# Patient Record
Sex: Male | Born: 2000 | Race: White | Hispanic: No | Marital: Single | Smoking: Never smoker
Health system: Southern US, Community
[De-identification: ages and names within clinical notes are randomized; demographics above are authoritative.]

---
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
## 2018-11-15 ENCOUNTER — Ambulatory Visit (HOSPITAL_COMMUNITY)
Admission: EM | Admit: 2018-11-15 | Discharge: 2018-11-15 | Disposition: A | Discharge status: Home / Self Care | Payer: Managed Care, Other (non HMO) | Attending: Family Medicine | Admitting: Family Medicine

## 2018-11-15 ENCOUNTER — Other Ambulatory Visit: Payer: Self-pay

## 2018-11-15 ENCOUNTER — Encounter (HOSPITAL_COMMUNITY): Payer: Self-pay

## 2018-11-15 DIAGNOSIS — B09 Unspecified viral infection characterized by skin and mucous membrane lesions: Secondary | ICD-10-CM

## 2018-11-15 MED ORDER — VALACYCLOVIR HCL 1 G PO TABS: 1000.0000 mg | ORAL_TABLET | Freq: Three times a day (TID) | ORAL | 0 refills | Status: DC

## 2018-11-15 NOTE — Discharge Instructions (Addendum)
Take the valacyclovir 3 times a day Take 2 doses today, 1 now and then 1 at bedtime Wash area daily and keep clean.  May use a cortisone cream for itching The culture report will be available in a few days.  We will call you with results

## 2018-11-15 NOTE — ED Provider Notes (Signed)
[facility]    CSN: [id number] Arrival date & time: 11/15/18  1512     History   Chief Complaint Chief Complaint  Patient presents with  . Rash    HPI Adam Burke is a 18 y.o. male.   HPI  Patient states he had unprotected sex approximately 1 month ago.  He does not have any penile discharge, burning, signs of STD.  He does have a rash that erupted on his thigh 4 days ago.  It burns and hurts.  Does not itch.  He is never anything like this before.  He does not have a history of HSV.  He does not recall if he had chickenpox as a child.  He may have had a chickenpox immunization.  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 History reviewed. No pertinent past medical history.  Patient Active Problem List   Diagnosis Date Noted  . Left leg injury, initial encounter 02/18/2017  . Injury of left thumb 02/07/2015  . Fracture phalanges, hand 05/15/2014    History reviewed. No pertinent surgical history.     Home Medications    Prior to Admission medications   Medication Sig Start Date End Date Taking? Authorizing Provider  valACYclovir (VALTREX) 1000 MG tablet Take 1 tablet (1,000 mg total) by mouth 3 (three) times daily. 11/15/18   Eustace Moore, MD    Family History Family History  Problem Relation Age of Onset  . Healthy Mother     Social History Social History   Tobacco Use  . Smoking status: Never Smoker  . Smokeless tobacco: Never Used  Substance Use Topics  . Alcohol use: No    Alcohol/week: 0.0 standard drinks  . Drug use: No     Allergies   Patient has no known allergies.   Review of Systems Review of Systems  Constitutional: Negative for chills and fever.  HENT: Negative for ear pain and sore throat.   Eyes: Negative for pain and visual disturbance.  Respiratory: Negative for cough and shortness of breath.   Cardiovascular: Negative for chest pain and palpitations.  Gastrointestinal: Negative for abdominal pain  and vomiting.  Genitourinary: Negative for dysuria and hematuria.  Musculoskeletal: Negative for arthralgias and back pain.  Skin: Positive for rash. Negative for color change.  Neurological: Negative for seizures and syncope.  All other systems reviewed and are negative.    Physical Exam Triage Vital Signs ED Triage Vitals  Enc Vitals Group     BP 11/15/18 1522 136/87     Pulse Rate 11/15/18 1522 82     Resp 11/15/18 1522 17     Temp 11/15/18 1522 98.4 F (36.9 C)     Temp Source 11/15/18 1522 Oral     SpO2 11/15/18 1522 99 %     Weight --      Height --      Head Circumference --      Peak Flow --      Pain Score 11/15/18 1520 3     Pain Loc --      Pain Edu? --      Excl. in GC? --    No data found.  Updated Vital Signs BP 136/87 (BP Location: Left Arm)   Pulse 82   Temp 98.4 F (36.9 C) (Oral)   Resp 17   SpO2 99%   Visual Acuity Right Eye Distance:   Left Eye Distance:   Bilateral Distance:    Right Eye Near:  Left Eye Near:    Bilateral Near:     Physical Exam Constitutional:      General: He is not in acute distress.    Appearance: Normal appearance. He is well-developed and normal weight.  HENT:     Head: Normocephalic and atraumatic.  Eyes:     Conjunctiva/sclera: Conjunctivae normal.     Pupils: Pupils are equal, round, and reactive to light.  Neck:     Musculoskeletal: Normal range of motion.  Cardiovascular:     Rate and Rhythm: Normal rate and regular rhythm.     Heart sounds: Normal heart sounds.  Pulmonary:     Effort: Pulmonary effort is normal. No respiratory distress.     Breath sounds: Normal breath sounds.  Abdominal:     General: Abdomen is flat. Bowel sounds are normal. There is no distension.     Palpations: Abdomen is soft.  Genitourinary:    Penis: Normal.      Scrotum/Testes: Normal.  Musculoskeletal: Normal range of motion.  Skin:    General: Skin is warm and dry.     Comments: Picture is of vesicular rash left  upper thigh.  Neurological:     Mental Status: He is alert.  Psychiatric:        Mood and Affect: Mood normal.        Behavior: Behavior normal.        UC Treatments / Results  Labs (all labs ordered are listed, but only abnormal results are displayed) Labs Reviewed  HSV CULTURE AND TYPING    EKG None  Radiology No results found.  Procedures Procedures (including critical care time)  Medications Ordered in UC Medications - No data to display  Initial Impression / Assessment and Plan / UC Course  I have reviewed the triage vital signs and the nursing notes.  Pertinent labs & imaging results that were available during my care of the patient were reviewed by me and considered in my medical decision making (see chart for details).     I told the patient that a clustered vesicular rash is likely a viral rash.  Whether this is herpes simplex or herpes zoster is difficult to tell.  The presentation looks a bit more like herpes zoster.  4 weeks after a sexual encounter is a bit long to get herpes simplex.  I am going to do a simplex culture and let him know the result. Final Clinical Impressions(s) / UC Diagnoses   Final diagnoses:  Viral rash     Discharge Instructions     Take the valacyclovir 3 times a day Take 2 doses today, 1 now and then 1 at bedtime Wash area daily and keep clean.  May use a cortisone cream for itching The culture report will be available in a few days.  We will call you with results    ED Prescriptions    Medication Sig Dispense Auth. Provider   valACYclovir (VALTREX) 1000 MG tablet Take 1 tablet (1,000 mg total) by mouth 3 (three) times daily. 21 tablet Eustace MooreNelson, Lamel Mccarley Sue, MD     Controlled Substance Prescriptions [place] Controlled Substance Registry consulted? Not Applicable   Eustace MooreNelson, Dinita Migliaccio Sue, MD 11/15/18 [phone]

## 2018-11-15 NOTE — ED Triage Notes (Signed)
Patient presents to Urgent Care with complaints of rash to inner thighs since 4 days ago. Patient states it is not on his genitals, just his inner thighs. Pt states he is allergic to poison ivy but it doesn't feel like that, it itches and burns. Pt put benadryl cream on it with some relief.

## 2018-11-15 NOTE — ED Notes (Signed)
Patient verbalizes understanding of discharge instructions. Opportunity for questioning and answers were provided. Patient discharged from [facility] by RN.  

## 2018-11-17 LAB — HSV CULTURE AND TYPING

## 2018-11-19 ENCOUNTER — Telehealth (HOSPITAL_COMMUNITY): Payer: Self-pay | Admitting: Emergency Medicine

## 2018-11-19 NOTE — Telephone Encounter (Signed)
Patient contacted and made aware of all results, all questions answered.   

## 2019-01-28 ENCOUNTER — Encounter (HOSPITAL_COMMUNITY): Payer: Self-pay | Admitting: Emergency Medicine

## 2019-01-28 ENCOUNTER — Ambulatory Visit (HOSPITAL_COMMUNITY)
Admission: EM | Admit: 2019-01-28 | Discharge: 2019-01-28 | Disposition: A | Discharge status: Home / Self Care | Payer: Managed Care, Other (non HMO)

## 2019-01-28 DIAGNOSIS — S01511A Laceration without foreign body of lip, initial encounter: Secondary | ICD-10-CM | POA: Diagnosis not present

## 2019-01-28 DIAGNOSIS — K130 Diseases of lips: Secondary | ICD-10-CM

## 2019-01-28 NOTE — ED Triage Notes (Signed)
Pt states around 3 hours ago he jumped into a pool and was holding his knee and hit himself in the face ona ccident while jumping in the pool. Pt has laceration to lower lip.

## 2019-01-28 NOTE — Discharge Instructions (Signed)
You may take 500mg Tylenol with ibuprofen 400-600mg every 6 hours for pain and inflammation. °

## 2019-01-28 NOTE — ED Provider Notes (Signed)
  MRN: [id number] DOB: [birth year]  Subjective:   Adam Burke is a 18 y.o. male presenting for suffering acute laceration to his lip today from jumping into a pool.  Patient has controlled bleeding. He is not currently taking any medications and has no known food or drug allergies.  Denies past medical and surgical history.  ROS  Objective:   Vitals: BP 116/64   Pulse 60   Temp 98.3 F (36.8 C)   Resp 16   SpO2 99%   Physical Exam Constitutional:      Appearance: Normal appearance. He is well-developed and normal weight.  HENT:     Head: Normocephalic and atraumatic.     Right Ear: External ear normal.     Left Ear: External ear normal.     Nose: Nose normal.     Mouth/Throat:     Mouth: Lacerations present.     Pharynx: Oropharynx is clear.   Eyes:     Extraocular Movements: Extraocular movements intact.     Pupils: Pupils are equal, round, and reactive to light.  Cardiovascular:     Rate and Rhythm: Normal rate.  Pulmonary:     Effort: Pulmonary effort is normal.  Neurological:     Mental Status: He is alert and oriented to person, place, and time.  Psychiatric:        Mood and Affect: Mood normal.        Behavior: Behavior normal.    Assessment and Plan :   1. Lip laceration, initial encounter   2. Traumatic lip pain     Counseled patient on wound care.  Use Tylenol and ibuprofen for pain relief. Counseled patient on potential for adverse effects with medications prescribed/recommended today, ER and return-to-clinic precautions discussed, patient verbalized understanding.    Jaynee Eagles, [place] 01/28/19 1829

## 2019-06-02 ENCOUNTER — Ambulatory Visit (HOSPITAL_COMMUNITY)
Admission: EM | Admit: 2019-06-02 | Discharge: 2019-06-02 | Disposition: A | Discharge status: Home / Self Care | Payer: Managed Care, Other (non HMO) | Attending: Urgent Care | Admitting: Urgent Care

## 2019-06-02 ENCOUNTER — Encounter (HOSPITAL_COMMUNITY): Payer: Self-pay

## 2019-06-02 ENCOUNTER — Other Ambulatory Visit: Payer: Self-pay

## 2019-06-02 ENCOUNTER — Ambulatory Visit (INDEPENDENT_AMBULATORY_CARE_PROVIDER_SITE_OTHER): Payer: Managed Care, Other (non HMO)

## 2019-06-02 DIAGNOSIS — M25572 Pain in left ankle and joints of left foot: Secondary | ICD-10-CM | POA: Diagnosis not present

## 2019-06-02 DIAGNOSIS — Y9367 Activity, basketball: Secondary | ICD-10-CM

## 2019-06-02 DIAGNOSIS — S93402A Sprain of unspecified ligament of left ankle, initial encounter: Secondary | ICD-10-CM

## 2019-06-02 MED ORDER — NAPROXEN 500 MG PO TABS: 500.0000 mg | ORAL_TABLET | Freq: Two times a day (BID) | ORAL | 0 refills | Status: DC

## 2019-06-02 NOTE — ED Triage Notes (Signed)
Patient presents to Urgent Care with complaints of left ankle pain and foot swelling since rolling it while playing baseball two nights ago. Patient reports he has been able to walk on it, has been keeping it elevated and putting ice on it.

## 2019-06-02 NOTE — ED Provider Notes (Signed)
  MRN: [id number] DOB: 2001-01-03  Subjective:   Adam Burke is a 18 y.o. male presenting for 2-day history of acute onset persistent left ankle pain and swelling.  Symptoms started after he was playing basketball, jumped and landed awkwardly rolling his left ankle.  He has since been able to walk on it but still has pain.  He is propped his leg up and has used some icing.  He is not currently taking any medications and has no known food or drug allergies.  Denies past medical and surgical history.  Family History  Problem Relation Age of Onset  . Healthy Mother     Social History   Tobacco Use  . Smoking status: Never Smoker  . Smokeless tobacco: Never Used  Substance Use Topics  . Alcohol use: Yes    Alcohol/week: 0.0 standard drinks    Comment: socially  . Drug use: No    ROS   Objective:   Vitals: BP 130/65 (BP Location: Left Arm)   Pulse 70   Temp 98.4 F (36.9 C) (Oral)   Resp 16   SpO2 99%   Physical Exam Constitutional:      Appearance: Normal appearance. He is well-developed and normal weight.  HENT:     Head: Normocephalic and atraumatic.     Right Ear: External ear normal.     Left Ear: External ear normal.     Nose: Nose normal.     Mouth/Throat:     Pharynx: Oropharynx is clear.  Eyes:     Extraocular Movements: Extraocular movements intact.     Pupils: Pupils are equal, round, and reactive to light.  Cardiovascular:     Rate and Rhythm: Normal rate.  Pulmonary:     Effort: Pulmonary effort is normal.  Musculoskeletal:     Left ankle: He exhibits decreased range of motion (Slight decrease in eversion and inversion) and swelling (1+). He exhibits no ecchymosis, no deformity, no laceration and normal pulse. Tenderness. Lateral malleolus and AITFL tenderness found. No medial malleolus, no CF ligament, no posterior TFL, no head of 5th metatarsal and no proximal fibula tenderness found. Achilles tendon exhibits no pain, no defect and normal Thompson's  test results.  Neurological:     Mental Status: He is alert and oriented to person, place, and time.  Psychiatric:        Mood and Affect: Mood normal.        Behavior: Behavior normal.     Dg Ankle Complete Left  Result Date: 06/02/2019 CLINICAL DATA:  Left ankle pain and swelling after injury EXAM: LEFT ANKLE COMPLETE - 3+ VIEW COMPARISON:  None. FINDINGS: There is no evidence of fracture, dislocation, or joint effusion. Small os peroneum and os trigonum. There is no evidence of arthropathy or other focal bone abnormality. No focal soft tissue swelling. IMPRESSION: Negative. Electronically Signed   By: Davina Poke M.D.   On: 06/02/2019 16:34     Assessment and Plan :   1. Acute left ankle pain   2. Sprain of left ankle, unspecified ligament, initial encounter     We will use conservative management including rice method, NSAID therapy for ankle sprain. Counseled patient on potential for adverse effects with medications prescribed/recommended today, ER and return-to-clinic precautions discussed, patient verbalized understanding.    Jaynee Eagles, [place] 06/02/19 1646

## 2019-11-09 ENCOUNTER — Ambulatory Visit: Payer: Self-pay

## 2019-11-09 ENCOUNTER — Encounter: Payer: Self-pay | Admitting: Family Medicine

## 2019-11-09 ENCOUNTER — Ambulatory Visit: Payer: Managed Care, Other (non HMO) | Admitting: Family Medicine

## 2019-11-09 ENCOUNTER — Other Ambulatory Visit: Payer: Self-pay

## 2019-11-09 VITALS — BP 137/89 | HR 74 | Ht 74.0 in | Wt 170.0 lb

## 2019-11-09 DIAGNOSIS — S83421A Sprain of lateral collateral ligament of right knee, initial encounter: Secondary | ICD-10-CM

## 2019-11-09 DIAGNOSIS — M25561 Pain in right knee: Secondary | ICD-10-CM

## 2019-11-09 NOTE — Progress Notes (Signed)
Adam Burke - 19 y.o. male MRN [id number]  Date of birth: [birth year]  SUBJECTIVE:  Including CC & ROS.  Chief Complaint  Patient presents with  . Knee Injury    right knee x 11/05/2019    Adam Burke is a 19 y.o. male that is presenting with right knee pain.  This injury occurred on 4/16.  He was playing basketball and felt a pain shortly afterwards.  The pain has improved.  Is occurring over the lateral aspect of the knee.  Has some trouble with flexion.  No history of similar pain.  No history of surgery.   Review of Systems See HPI   HISTORY: Past Medical, Surgical, Social, and Family History Reviewed & Updated per EMR.   Pertinent Historical Findings include:  No past medical history on file.  No past surgical history on file.  Family History  Problem Relation Age of Onset  . Healthy Mother     Social History   Socioeconomic History  . Marital status: Single    Spouse name: Not on file  . Number of children: Not on file  . Years of education: Not on file  . Highest education level: Not on file  Occupational History  . Not on file  Tobacco Use  . Smoking status: Never Smoker  . Smokeless tobacco: Never Used  Substance and Sexual Activity  . Alcohol use: Yes    Alcohol/week: 0.0 standard drinks    Comment: socially  . Drug use: No  . Sexual activity: Not on file  Other Topics Concern  . Not on file  Social History Narrative  . Not on file   Social Determinants of Health   Financial Resource Strain:   . Difficulty of Paying Living Expenses:   Food Insecurity:   . Worried About Charity fundraiser in the Last Year:   . Arboriculturist in the Last Year:   Transportation Needs:   . Film/video editor (Medical):   Marland Kitchen Lack of Transportation (Non-Medical):   Physical Activity:   . Days of Exercise per Week:   . Minutes of Exercise per Session:   Stress:   . Feeling of Stress :   Social Connections:   . Frequency of Communication with Friends and  Family:   . Frequency of Social Gatherings with Friends and Family:   . Attends Religious Services:   . Active Member of Clubs or Organizations:   . Attends Archivist Meetings:   Marland Kitchen Marital Status:   Intimate Partner Violence:   . Fear of Current or Ex-Partner:   . Emotionally Abused:   Marland Kitchen Physically Abused:   . Sexually Abused:      PHYSICAL EXAM:  VS: BP 137/89   Pulse 74   Ht 6\' 2"  (1.88 m)   Wt 170 lb (77.1 kg)   BMI 21.83 kg/m  Physical Exam Gen: NAD, alert, cooperative with exam, well-appearing MSK:  Right knee: No effusion. Normal extension. Limited flexion compared to contralateral side. More stability with varus stress. Negative Thessaly and McMurray's test. Neurovascularly intact  Limited ultrasound: Right knee:  No effusion within the suprapatellar pouch. Normal quad tendon and patellar tendon. Normal-appearing medial joint space and lateral joint space. There is mild hypoechoic change at the origin of the LCL to suggest a sprain  Summary: Findings would suggest a sprain of the LCL  Ultrasound and interpretation by Adam Coots, MD    ASSESSMENT & PLAN:   Sprain of lateral collateral ligament  of right knee Seems to be a slight change at the origin of the LCL to suggest a sprain.  No effusion observed on exam today. -Counseled on home exercise therapy and supportive care. -Counseled on compression. -Could consider physical therapy if needed.

## 2019-11-09 NOTE — Patient Instructions (Signed)
Nice to meet you Please try ice as needed You can try compression  Please try the exercises   Please send me a message in MyChart with any questions or updates.  Please see me back in 4 weeks or as needed.   --Dr. Jordan Likes

## 2019-11-10 DIAGNOSIS — S83421A Sprain of lateral collateral ligament of right knee, initial encounter: Secondary | ICD-10-CM | POA: Insufficient documentation

## 2019-11-10 NOTE — Assessment & Plan Note (Signed)
Seems to be a slight change at the origin of the LCL to suggest a sprain.  No effusion observed on exam today. -Counseled on home exercise therapy and supportive care. -Counseled on compression. -Could consider physical therapy if needed.

## 2021-02-20 ENCOUNTER — Ambulatory Visit: Payer: 59 | Admitting: Sports Medicine

## 2021-03-01 ENCOUNTER — Ambulatory Visit: Payer: 59 | Admitting: Sports Medicine

## 2022-05-10 ENCOUNTER — Ambulatory Visit (HOSPITAL_COMMUNITY): Payer: Self-pay

## 2022-08-15 ENCOUNTER — Ambulatory Visit
Admission: RE | Admit: 2022-08-15 | Discharge: 2022-08-15 | Disposition: A | Discharge status: Home / Self Care | Payer: 59 | Source: Ambulatory Visit | Attending: Sports Medicine | Admitting: Sports Medicine

## 2022-08-15 ENCOUNTER — Ambulatory Visit: Payer: 59 | Admitting: Sports Medicine

## 2022-08-15 VITALS — BP 126/78 | Ht 75.0 in | Wt 175.0 lb

## 2022-08-15 DIAGNOSIS — M25511 Pain in right shoulder: Secondary | ICD-10-CM

## 2022-08-15 DIAGNOSIS — G8929 Other chronic pain: Secondary | ICD-10-CM

## 2022-08-15 NOTE — Progress Notes (Signed)
Adam Burke - 22 y.o. male MRN [id number]  Date of birth: [birth year]    CHIEF COMPLAINT:   R shoulder pain    SUBJECTIVE:   HPI:  Pleasant 22 year old male comes clinic to be evaluated for right shoulder pain.  He was last evaluated here for right shoulder pain in 2016.  At that time, he was in high school and a baseball player.  He is right handed. He had some pain with throwing.  He had an MRI arthrogram that showed a posterior labral tear.  He underwent surgery to fix this with Dr. Mardelle Matte in 2016.  He says he generally did well postoperatively.  He did his physical therapy but has not maintained shoulder exercises  longitudinally.  Over the last several years, his shoulder has progressively gotten worse.  He notices significant weakness there now.  When he threw a football at the beach he was sore for a week. He has been unable to throw a baseball at all over the last several months.  Few months ago he was playing basketball and collided with another player and felt the right shoulder sublux out of place.  He now says that shoulder feels similar to how it used to feel back in 2016 when he had a labral tear. He cannot sleep on it. He has tried some oral anti-inflammatories as needed but has not gotten much relief.  He denies any numbness or tingling down the arm.  ROS:     See HPI  PERTINENT  PMH / PSH FH / / SH:  Past Medical, Surgical, Social, and Family History Reviewed & Updated in the EMR.  Pertinent findings include:  Posterior labral tear right shoulder 2016  OBJECTIVE: BP 126/78   Ht 6\' 3"  (1.905 m)   Wt 175 lb (79.4 kg)   BMI 21.87 kg/m   Physical Exam:  Vital signs are reviewed.  GEN: Alert and oriented, NAD Pulm: Breathing unlabored PSY: normal mood, congruent affect  MSK: Right shoulder -no obvious deformity.  He is nontender to palpation over the distal clavicle, AC joint, or biceps tendon in the bicipital groove.  Full range of motion in all directions.  5/5 strength  throughout cuff testing.  He has a negative Hawkins sign.  He has a negative empty can test.  He has an exquisitely positive O'Brien's test that elicits pain and weakness.  He has a mildly positive apprehension test.  He has a mildly positive crank test that elicits some pain anteriorly.  He has a positive sulcus sign.  He is neurovascularly intact distally.  ASSESSMENT & PLAN:  1.  Returning right shoulder pain - rule out labral tear  -Patient's history and exam are concerning for recurrent labral pathology that may warrant surgical intervention.  Therefore, we will go ahead and order an x-ray followed by MR arthrogram to better evaluate the integrity of his labrum.  Will follow-up with him via MyChart to discuss the results of the arthrogram.  If there is something that would need surgical intervention, he would be happy to return to [facility] to see Dr.Landau.  All questions were answered and he agrees to the plan.  Dortha Kern, MD PGY-4, Sports Medicine Fellow [place]   Patient seen and evaluated with the sports medicine fellow.  I agree with the above plan of care.  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  X-rays today are unremarkable so we will go  ahead and proceed with an MRI arthrogram to evaluate further.  I will message Adam Burke with those results through MyChart once available.  This note was dictated using Dragon naturally speaking software and may contain errors in syntax, spelling, or content which have not been identified prior to signing this note.

## 2022-09-06 ENCOUNTER — Ambulatory Visit (HOSPITAL_COMMUNITY)
Admission: EM | Admit: 2022-09-06 | Discharge: 2022-09-06 | Disposition: A | Discharge status: Home / Self Care | Payer: 59

## 2022-09-06 ENCOUNTER — Encounter (HOSPITAL_COMMUNITY): Payer: Self-pay

## 2022-09-06 DIAGNOSIS — L02811 Cutaneous abscess of head [any part, except face]: Secondary | ICD-10-CM | POA: Diagnosis not present

## 2022-09-06 DIAGNOSIS — L0291 Cutaneous abscess, unspecified: Secondary | ICD-10-CM

## 2022-09-06 MED ORDER — LIDOCAINE HCL (PF) 1 % IJ SOLN
INTRAMUSCULAR | Status: AC
  Filled 2022-09-06: qty 2

## 2022-09-06 MED ORDER — LIDOCAINE-EPINEPHRINE 1 %-1:100000 IJ SOLN
INTRAMUSCULAR | Status: AC
  Filled 2022-09-06: qty 1

## 2022-09-06 MED ORDER — CEFTRIAXONE SODIUM 1 G IJ SOLR
1.0000 g | Freq: Once | INTRAMUSCULAR | Status: AC
  Administered 2022-09-06: 1 g via INTRAMUSCULAR

## 2022-09-06 MED ORDER — DOXYCYCLINE HYCLATE 100 MG PO CAPS: 100.0000 mg | ORAL_CAPSULE | Freq: Two times a day (BID) | ORAL | 0 refills | Status: DC

## 2022-09-06 MED ORDER — CEFTRIAXONE SODIUM 1 G IJ SOLR
INTRAMUSCULAR | Status: AC
  Filled 2022-09-06: qty 10

## 2022-09-06 NOTE — ED Provider Notes (Signed)
[facility]    CSN: [id number] Arrival date & time: 09/06/22  1505      History   Chief Complaint Chief Complaint  Patient presents with   Abscess    HPI Adam Burke is a 22 y.o. male wjho presents with having a lump on his L temple and he squeezed some puss out of it yesterday pm, this am woke up with swelling of this area spread to his upper lid, brow, and little of lower lid. He denies pain behind his eyes when he moves his eyes around. Only has local pain where he and his father squeezed and obtained some puss. He denies fever of hx of abscess.   History reviewed. No pertinent past medical history.  Patient Active Problem List   Diagnosis Date Noted   Sprain of lateral collateral ligament of right knee 11/10/2019   Fracture phalanges, hand 05/15/2014    History reviewed. No pertinent surgical history.     Home Medications    Prior to Admission medications   Medication Sig Start Date End Date Taking? Authorizing Provider  doxycycline (VIBRAMYCIN) 100 MG capsule Take 1 capsule (100 mg total) by mouth 2 (two) times daily. 09/06/22  Yes Rodriguez-Southworth, Sunday Spillers, PA-C    Family History Family History  Problem Relation Age of Onset   Healthy Mother     Social History Social History   Tobacco Use   Smoking status: Never   Smokeless tobacco: Never  Substance Use Topics   Alcohol use: Yes    Alcohol/week: 0.0 standard drinks of alcohol    Comment: socially   Drug use: No     Allergies   Patient has no known allergies.   Review of Systems Review of Systems  Constitutional:  Negative for fever.  HENT:  Positive for facial swelling.   Skin:  Positive for color change and wound. Negative for rash.     Physical Exam Triage Vital Signs ED Triage Vitals  Enc Vitals Group     BP 09/06/22 1528 125/84     Pulse Rate 09/06/22 1528 98     Resp 09/06/22 1528 16     Temp 09/06/22 1528 98.2 F (36.8 C)     Temp Source 09/06/22 1528 Oral      SpO2 09/06/22 1528 98 %     Weight --      Height --      Head Circumference --      Peak Flow --      Pain Score 09/06/22 1527 7     Pain Loc --      Pain Edu? --      Excl. in [place]? --    No data found.  Updated Vital Signs BP 125/84 (BP Location: Left Arm)   Pulse 98   Temp 98.2 F (36.8 C) (Oral)   Resp 16   SpO2 98%   Visual Acuity Right Eye Distance:   Left Eye Distance:   Bilateral Distance:    Right Eye Near:   Left Eye Near:    Bilateral Near:     Physical Exam Vitals and nursing note reviewed.  Constitutional:      General: He is not in acute distress.    Appearance: He is normal weight. He is not toxic-appearing.  Eyes:     General: No scleral icterus.    Conjunctiva/sclera: Conjunctivae normal.  Pulmonary:     Effort: Pulmonary effort is normal.  Musculoskeletal:        General:  Normal range of motion.     Cervical back: Neck supple.  Lymphadenopathy:     Cervical: No cervical adenopathy.  Skin:    General: Skin is warm.     Comments: Has  1 cm x 1 cm induration on L temple at the end of his brow area with white crusted area in the center. Has mild erythema, but moderate swelling of this area that extends to his brow and upper lid and mild on lower orbit/face area. There is no erythema on the lids swelling area like cellulitis. This swollen area is not hot.   Neurological:     Mental Status: He is alert and oriented to person, place, and time.     Gait: Gait normal.  Psychiatric:        Mood and Affect: Mood normal.        Behavior: Behavior normal.        Thought Content: Thought content normal.        Judgment: Judgment normal.      UC Treatments / Results  Labs (all labs ordered are listed, but only abnormal results are displayed) Labs Reviewed - No data to display  EKG   Radiology No results found.  Procedures Incision and Drainage  Date/Time: 09/06/2022 5:59 PM  Performed by: Shelby Mattocks, PA-C Authorized by:  Shelby Mattocks, PA-C   Consent:    Consent obtained:  Verbal   Consent given by:  Patient   Risks, benefits, and alternatives were discussed: yes     Risks discussed:  Incomplete drainage and pain   Alternatives discussed:  Observation Universal protocol:    Patient identity confirmed:  Verbally with patient Location:    Type:  Abscess   Location:  Head   Head/neck location: L temple. Pre-procedure details:    Skin preparation:  Povidone-iodine Sedation:    Sedation type:  None Anesthesia:    Anesthesia method:  Topical application Procedure type:    Complexity:  Simple Procedure details:    Incision types:  Stab incision (1/2 cm)   Incision depth:  Dermal   Wound management:  Probed and deloculated   Drainage:  Bloody   Drainage amount:  Scant   Wound treatment:  Wound left open   Packing materials:  None Post-procedure details:    Procedure completion:  Tolerated well, no immediate complications Comments:     Gauze applied with tape  (including critical care time)  Medications Ordered in UC Medications  cefTRIAXone (ROCEPHIN) injection 1 g (1 g Intramuscular Given 09/06/22 1602)    Initial Impression / Assessment and Plan / UC Course  I have reviewed the triage vital signs and the nursing notes.  L face abscess with surrounding swelling of soft tissue  He was given Rocephin 1 G IM here and placed on Doxy as noted.  Needs to go to ER if swelling gets worse, develops eye ball pain with eye movement and fever. See instructions   Final Clinical Impressions(s) / UC Diagnoses   Final diagnoses:  Abscess     Discharge Instructions      You may take Ibuprofen up to 800 mg every 8 hours  If you develop a fever, or pain inside your orbit when moving your left eye, go to the ER      ED Prescriptions     Medication Sig Dispense Auth. Provider   doxycycline (VIBRAMYCIN) 100 MG capsule Take 1 capsule (100 mg total) by mouth 2 (two) times daily. 20  capsule Rodriguez-Southworth,  Sunday Spillers, PA-C      PDMP not reviewed this encounter.   Shelby Mattocks, [place] 09/06/22 1803

## 2022-09-06 NOTE — Discharge Instructions (Addendum)
You may take Ibuprofen up to 800 mg every 8 hours  If you develop a fever, or pain inside your orbit when moving your left eye, go to the ER

## 2022-09-06 NOTE — ED Triage Notes (Signed)
Pt is here for swollen left eye ausing pain and discomfort x 1day

## 2022-09-09 ENCOUNTER — Ambulatory Visit
Admission: RE | Admit: 2022-09-09 | Discharge: 2022-09-09 | Disposition: A | Discharge status: Home / Self Care | Payer: 59 | Source: Ambulatory Visit | Attending: Sports Medicine | Admitting: Sports Medicine

## 2022-09-09 DIAGNOSIS — G8929 Other chronic pain: Secondary | ICD-10-CM

## 2022-09-09 MED ORDER — IOPAMIDOL (ISOVUE-M 200) INJECTION 41%
15.0000 mL | Freq: Once | INTRAMUSCULAR | Status: AC
  Administered 2022-09-09: 15 mL via INTRA_ARTICULAR

## 2022-09-11 ENCOUNTER — Encounter: Payer: Self-pay | Admitting: Sports Medicine

## 2022-11-04 ENCOUNTER — Encounter: Payer: Self-pay | Admitting: *Deleted

## 2022-12-03 ENCOUNTER — Other Ambulatory Visit: Payer: Self-pay

## 2022-12-03 ENCOUNTER — Encounter (HOSPITAL_COMMUNITY): Payer: Self-pay | Admitting: *Deleted

## 2022-12-03 ENCOUNTER — Ambulatory Visit (HOSPITAL_COMMUNITY)
Admission: EM | Admit: 2022-12-03 | Discharge: 2022-12-03 | Disposition: A | Discharge status: Home / Self Care | Payer: 59 | Attending: Emergency Medicine | Admitting: Emergency Medicine

## 2022-12-03 DIAGNOSIS — R590 Localized enlarged lymph nodes: Secondary | ICD-10-CM | POA: Diagnosis present

## 2022-12-03 LAB — CBC WITH DIFFERENTIAL/PLATELET
Abs Immature Granulocytes: 0.02 10*3/uL (ref 0.00–0.07)
Basophils Absolute: 0 10*3/uL (ref 0.0–0.1)
Basophils Relative: 0 %
Eosinophils Absolute: 0.1 10*3/uL (ref 0.0–0.5)
Eosinophils Relative: 1 %
HCT: 45.9 % (ref 39.0–52.0)
Hemoglobin: 15.3 g/dL (ref 13.0–17.0)
Immature Granulocytes: 0 %
Lymphocytes Relative: 24 %
Lymphs Abs: 2 10*3/uL (ref 0.7–4.0)
MCH: 29.9 pg (ref 26.0–34.0)
MCHC: 33.3 g/dL (ref 30.0–36.0)
MCV: 89.6 fL (ref 80.0–100.0)
Monocytes Absolute: 0.9 10*3/uL (ref 0.1–1.0)
Monocytes Relative: 10 %
Neutro Abs: 5.5 10*3/uL (ref 1.7–7.7)
Neutrophils Relative %: 65 %
Platelets: 303 10*3/uL (ref 150–400)
RBC: 5.12 MIL/uL (ref 4.22–5.81)
RDW: 12.5 % (ref 11.5–15.5)
WBC: 8.5 10*3/uL (ref 4.0–10.5)
nRBC: 0 % (ref 0.0–0.2)

## 2022-12-03 LAB — POCT MONO SCREEN (KUC): Mono, POC: NEGATIVE

## 2022-12-03 NOTE — Discharge Instructions (Addendum)
The monospot test was negative. Surprisingly your blood work came back already and was completely normal  Continue symptomatic care with ibuprofen, Tylenol, warm or cool compress  There is a QR code on the last page that you can scan to get set up with a primary care provider.  I recommend to establish and follow-up sometime this month for further evaluation.

## 2022-12-03 NOTE — ED Provider Notes (Signed)
[facility]    CSN: [id number] Arrival date & time: 12/03/22  1509     History   Chief Complaint Chief Complaint  Patient presents with   Lymphadenopathy    HPI Adam Burke is a 22 y.o. male.  2 day history of anterior cervical LAD A little tender to touch and feels swollen Has used ibuprofen and warm compress that helps  Denies other symptoms No fever, chills, sore throat, shortness of breath, cough, runny nose or congestion, abd pain, NVD, rash, weakness, fatigue, unintentional weight loss or gain, bruising or bleeding. No swelling elsewhere No sick contacts. Denies any recent illness No history of this  History reviewed. No pertinent past medical history.  Patient Active Problem List   Diagnosis Date Noted   Sprain of lateral collateral ligament of right knee 11/10/2019   Fracture phalanges, hand 05/15/2014    History reviewed. No pertinent surgical history.   Home Medications    Prior to Admission medications   Not on File    Family History Family History  Problem Relation Age of Onset   Healthy Mother     Social History Social History   Tobacco Use   Smoking status: Never   Smokeless tobacco: Never  Substance Use Topics   Alcohol use: Yes    Alcohol/week: 0.0 standard drinks of alcohol    Comment: socially   Drug use: No     Allergies   Patient has no known allergies.   Review of Systems Review of Systems As per HPI  Physical Exam Triage Vital Signs ED Triage Vitals  Enc Vitals Group     BP 12/03/22 1600 125/76     Pulse Rate 12/03/22 1600 75     Resp 12/03/22 1600 18     Temp 12/03/22 1600 98.1 F (36.7 C)     Temp src --      SpO2 12/03/22 1600 97 %     Weight --      Height --      Head Circumference --      Peak Flow --      Pain Score 12/03/22 1558 1     Pain Loc --      Pain Edu? --      Excl. in [place]? --    No data found.  Updated Vital Signs BP 125/76   Pulse 75   Temp 98.1 F (36.7 C)   Resp  18   SpO2 97%     Physical Exam Vitals and nursing note reviewed.  Constitutional:      General: He is not in acute distress.    Appearance: Normal appearance. He is not ill-appearing.  HENT:     Right Ear: Tympanic membrane and ear canal normal.     Left Ear: Tympanic membrane and ear canal normal.     Nose: No rhinorrhea.     Mouth/Throat:     Mouth: Mucous membranes are moist.     Pharynx: Oropharynx is clear. Uvula midline. No pharyngeal swelling, oropharyngeal exudate, posterior oropharyngeal erythema or uvula swelling.     Tonsils: No tonsillar exudate or tonsillar abscesses. 1+ on the right. 1+ on the left.     Comments: Tolerating secretions, normal phonation Eyes:     Conjunctiva/sclera: Conjunctivae normal.  Neck:     Comments: Bilateral anterior cervical LAD, mildly tender. No palpable nodes elsewhere. Cardiovascular:     Rate and Rhythm: Normal rate and regular rhythm.     Heart sounds: Normal  heart sounds.  Pulmonary:     Effort: Pulmonary effort is normal.     Breath sounds: Normal breath sounds.  Abdominal:     Palpations: Abdomen is soft.  Musculoskeletal:        General: Normal range of motion.  Lymphadenopathy:     Cervical: Cervical adenopathy present.  Skin:    General: Skin is warm and dry.     Coloration: Skin is not pale.     Findings: No bruising, erythema or rash.  Neurological:     Mental Status: He is alert and oriented to person, place, and time.     UC Treatments / Results  Labs (all labs ordered are listed, but only abnormal results are displayed) Labs Reviewed  CBC WITH DIFFERENTIAL/PLATELET  POCT MONO SCREEN [facility])    EKG  Radiology No results found.  Procedures Procedures (including critical care time)  Medications Ordered in UC Medications - No data to display  Initial Impression / Assessment and Plan / UC Course  I have reviewed the triage vital signs and the nursing notes.  Pertinent labs & imaging results that were  available during my care of the patient were reviewed by me and considered in my medical decision making (see chart for details).  Afebrile and well appearing.   Considered strep testing today although low concern for this, centor score is 2 given LAD with absence of cough. Will defer. Monospot negative. CBC with differential is unremarkable.   No red flags at this time.  Discussed possible etiologies.  Will continue symptomatic care with ibuprofen, Tylenol, warm compress He will establish with primary care and follow-up with them if this is persisting.  Otherwise return and ED precautions discussed.  No questions at this time  Final Clinical Impressions(s) / UC Diagnoses   Final diagnoses:  LAD (lymphadenopathy), anterior cervical     Discharge Instructions      The monospot test was negative. Surprisingly your blood work came back already and was completely normal  Continue symptomatic care with ibuprofen, Tylenol, warm or cool compress  There is a QR code on the last page that you can scan to get set up with a primary care provider.  I recommend to establish and follow-up sometime this month for further evaluation.     ED Prescriptions   None    PDMP not reviewed this encounter.   Marlow Baars, [place] 12/03/22 1740

## 2022-12-03 NOTE — ED Triage Notes (Signed)
Pt reports swollen lymph nodes in his neck that started sat.

## 2023-12-17 ENCOUNTER — Ambulatory Visit (HOSPITAL_COMMUNITY): Admission: EM | Admit: 2023-12-17 | Discharge: 2023-12-17 | Discharge status: Against Medical Advice

## 2023-12-17 NOTE — ED Notes (Signed)
 Patient called to come back to a room, no answer.

## 2023-12-17 NOTE — ED Notes (Signed)
 Patient called for second time to come to a room, still not in [facility].
